# Patient Record
Sex: Female | Born: 1965 | Race: Black or African American | Hispanic: No | Marital: Married | Smoking: Never smoker
Health system: Southern US, Community
[De-identification: ages and names within clinical notes are randomized; demographics above are authoritative.]

## PROBLEM LIST (undated history)

## (undated) DIAGNOSIS — E785 Hyperlipidemia, unspecified: Secondary | ICD-10-CM

## (undated) HISTORY — DX: Hyperlipidemia, unspecified: E78.5

## (undated) HISTORY — PX: GANGLION CYST EXCISION: SHX1691

## (undated) HISTORY — PX: VEIN LIGATION AND STRIPPING: SHX2653

---

## 2016-04-11 DIAGNOSIS — L731 Pseudofolliculitis barbae: Secondary | ICD-10-CM | POA: Diagnosis not present

## 2016-04-11 DIAGNOSIS — N898 Other specified noninflammatory disorders of vagina: Secondary | ICD-10-CM | POA: Diagnosis not present

## 2016-04-13 DIAGNOSIS — Z01419 Encounter for gynecological examination (general) (routine) without abnormal findings: Secondary | ICD-10-CM | POA: Diagnosis not present

## 2016-04-14 DIAGNOSIS — Z01419 Encounter for gynecological examination (general) (routine) without abnormal findings: Secondary | ICD-10-CM | POA: Diagnosis not present

## 2016-04-14 DIAGNOSIS — Z113 Encounter for screening for infections with a predominantly sexual mode of transmission: Secondary | ICD-10-CM | POA: Diagnosis not present

## 2016-04-14 DIAGNOSIS — Z1151 Encounter for screening for human papillomavirus (HPV): Secondary | ICD-10-CM | POA: Diagnosis not present

## 2016-04-21 DIAGNOSIS — J0100 Acute maxillary sinusitis, unspecified: Secondary | ICD-10-CM | POA: Diagnosis not present

## 2016-04-21 DIAGNOSIS — J029 Acute pharyngitis, unspecified: Secondary | ICD-10-CM | POA: Diagnosis not present

## 2016-04-21 DIAGNOSIS — Z6825 Body mass index (BMI) 25.0-25.9, adult: Secondary | ICD-10-CM | POA: Diagnosis not present

## 2016-04-28 DIAGNOSIS — Z113 Encounter for screening for infections with a predominantly sexual mode of transmission: Secondary | ICD-10-CM | POA: Diagnosis not present

## 2016-04-28 DIAGNOSIS — A6009 Herpesviral infection of other urogenital tract: Secondary | ICD-10-CM | POA: Diagnosis not present

## 2016-05-06 ENCOUNTER — Encounter (HOSPITAL_BASED_OUTPATIENT_CLINIC_OR_DEPARTMENT_OTHER): Payer: Self-pay | Admitting: *Deleted

## 2016-05-06 ENCOUNTER — Emergency Department (HOSPITAL_BASED_OUTPATIENT_CLINIC_OR_DEPARTMENT_OTHER)
Admission: EM | Admit: 2016-05-06 | Discharge: 2016-05-06 | Disposition: A | Discharge status: Home / Self Care | Payer: BLUE CROSS/BLUE SHIELD | Attending: Emergency Medicine | Admitting: Emergency Medicine

## 2016-05-06 DIAGNOSIS — L259 Unspecified contact dermatitis, unspecified cause: Secondary | ICD-10-CM | POA: Insufficient documentation

## 2016-05-06 DIAGNOSIS — Z79899 Other long term (current) drug therapy: Secondary | ICD-10-CM | POA: Insufficient documentation

## 2016-05-06 DIAGNOSIS — R21 Rash and other nonspecific skin eruption: Secondary | ICD-10-CM | POA: Diagnosis present

## 2016-05-06 MED ORDER — HYDROCORTISONE 1 % EX CREA: TOPICAL_CREAM | CUTANEOUS | 0 refills | Status: AC

## 2016-05-06 NOTE — ED Triage Notes (Signed)
Patient states she developed a rash on her left mid arm about 5-7 days ago.  Now rash has spread to right upper arm.  Has used itching cream and benadryl with minimal relief.

## 2016-05-06 NOTE — Discharge Instructions (Signed)
Use a hydrocortisone cream twice a day to both affected areas. Would expect some resolution over the next 7 days. Return for any new or worse symptoms.

## 2016-05-06 NOTE — ED Provider Notes (Signed)
[facility]   CSN: [id number] Arrival date & time: 05/06/16  4098     History   Chief Complaint Chief Complaint  Patient presents with  . Rash    HPI Jamie Parker is a 50 y.o. female.  Patient with 7 day history of rash to left arm itchy in nature not painful few days later similar thing started on the right upper arm. No other symptoms no fever no nausea no vomiting no headaches no bodyaches no history of any bug bites. No history of any exposure to poison ivy. No pets at home. No history of similar problem.      History reviewed. No pertinent past medical history.  There are no active problems to display for this patient.   Past Surgical History:  Procedure Laterality Date  . GANGLION CYST EXCISION Right   . VEIN LIGATION AND STRIPPING      OB History    No data available       Home Medications    Prior to Admission medications   Medication Sig Start Date End Date Taking? Authorizing Provider  Multiple Vitamin (MULTIVITAMIN) tablet Take 1 tablet by mouth daily.   Yes Historical Provider, MD  Omega-3 Fatty Acids (FISH OIL) 1000 MG CAPS Take 1,400 mg by mouth 1 day or 1 dose.   Yes Historical Provider, MD  hydrocortisone cream 1 % Apply to affected area 2 times daily 05/06/16   Vanetta Mulders, MD    Family History No family history on file.  Social History Social History  Substance Use Topics  . Smoking status: Not on file  . Smokeless tobacco: Not on file  . Alcohol use Not on file     Allergies   Shellfish allergy   Review of Systems Review of Systems  Constitutional: Negative for fever.  HENT: Negative for congestion.   Eyes: Negative for redness.  Respiratory: Negative for shortness of breath.   Cardiovascular: Negative for chest pain.  Gastrointestinal: Negative for abdominal pain.  Genitourinary: Negative for dysuria and hematuria.  Musculoskeletal: Negative for back pain and myalgias.  Skin: Positive  for rash.  Neurological: Negative for headaches.  Hematological: Does not bruise/bleed easily.  Psychiatric/Behavioral: Negative for confusion.     Physical Exam Updated Vital Signs BP 135/86 (BP Location: Left Arm)   Pulse (!) 56   Temp 98.3 F (36.8 C) (Oral)   Resp 18   Ht 5\' 5"  (1.651 m)   Wt 68 kg   SpO2 100%   BMI 24.96 kg/m   Physical Exam  Constitutional: She is oriented to person, place, and time. She appears well-developed and well-nourished. No distress.  HENT:  Head: Normocephalic and atraumatic.  Mouth/Throat: Oropharynx is clear and moist.  Eyes: EOM are normal. Pupils are equal, round, and reactive to light.  Pulmonary/Chest: Effort normal and breath sounds normal.  Abdominal: Soft. Bowel sounds are normal.  Neurological: She is alert and oriented to person, place, and time.  Skin: Skin is warm. Rash noted.  Left arm near elbow with about a 5 cm area of superficial bumps without vesicles area of some erythema no evidence of increased warmth or infection. Similar area on right arm higher up.  Nursing note and vitals reviewed.    ED Treatments / Results  Labs (all labs ordered are listed, but only abnormal results are displayed) Labs Reviewed - No data to display  EKG  EKG Interpretation None       Radiology No results  found.  Procedures Procedures (including critical care time)  Medications Ordered in ED Medications - No data to display   Initial Impression / Assessment and Plan / ED Course  I have reviewed the triage vital signs and the nursing notes.  Pertinent labs & imaging results that were available during my care of the patient were reviewed by me and considered in my medical decision making (see chart for details).  Clinical Course   Findings on the skin left arm near the elbow and right upper arm consistent with a contact dermatitis source not clear. Will treat with hydrocortisone cream. No systemic symptoms patient nontoxic no  acute distress.   Final Clinical Impressions(s) / ED Diagnoses   Final diagnoses:  Contact dermatitis, unspecified contact dermatitis type, unspecified trigger    New Prescriptions New Prescriptions   HYDROCORTISONE CREAM 1 %    Apply to affected area 2 times daily     Vanetta MuldersScott Cher Franzoni, MD 05/06/16 1128

## 2016-06-23 DIAGNOSIS — L810 Postinflammatory hyperpigmentation: Secondary | ICD-10-CM | POA: Diagnosis not present

## 2016-07-05 DIAGNOSIS — A6004 Herpesviral vulvovaginitis: Secondary | ICD-10-CM | POA: Diagnosis not present

## 2016-07-05 DIAGNOSIS — Z7689 Persons encountering health services in other specified circumstances: Secondary | ICD-10-CM | POA: Diagnosis not present

## 2016-07-05 DIAGNOSIS — K648 Other hemorrhoids: Secondary | ICD-10-CM | POA: Diagnosis not present

## 2016-07-14 DIAGNOSIS — D485 Neoplasm of uncertain behavior of skin: Secondary | ICD-10-CM | POA: Diagnosis not present

## 2016-07-25 DIAGNOSIS — Z1231 Encounter for screening mammogram for malignant neoplasm of breast: Secondary | ICD-10-CM | POA: Diagnosis not present

## 2016-07-25 DIAGNOSIS — L309 Dermatitis, unspecified: Secondary | ICD-10-CM | POA: Diagnosis not present

## 2016-08-15 DIAGNOSIS — N760 Acute vaginitis: Secondary | ICD-10-CM | POA: Diagnosis not present

## 2016-09-06 DIAGNOSIS — Z800 Family history of malignant neoplasm of digestive organs: Secondary | ICD-10-CM | POA: Diagnosis not present

## 2016-09-06 DIAGNOSIS — Z1211 Encounter for screening for malignant neoplasm of colon: Secondary | ICD-10-CM | POA: Diagnosis not present

## 2016-09-26 DIAGNOSIS — T7840XA Allergy, unspecified, initial encounter: Secondary | ICD-10-CM | POA: Diagnosis not present

## 2016-09-26 DIAGNOSIS — L299 Pruritus, unspecified: Secondary | ICD-10-CM | POA: Diagnosis not present

## 2016-11-29 DIAGNOSIS — I872 Venous insufficiency (chronic) (peripheral): Secondary | ICD-10-CM | POA: Diagnosis not present

## 2016-11-29 DIAGNOSIS — Z23 Encounter for immunization: Secondary | ICD-10-CM | POA: Diagnosis not present

## 2016-11-29 DIAGNOSIS — Z0000 Encounter for general adult medical examination without abnormal findings: Secondary | ICD-10-CM | POA: Diagnosis not present

## 2016-11-29 DIAGNOSIS — I83893 Varicose veins of bilateral lower extremities with other complications: Secondary | ICD-10-CM | POA: Diagnosis not present

## 2016-12-05 DIAGNOSIS — Z0000 Encounter for general adult medical examination without abnormal findings: Secondary | ICD-10-CM | POA: Diagnosis not present

## 2016-12-14 DIAGNOSIS — E782 Mixed hyperlipidemia: Secondary | ICD-10-CM | POA: Diagnosis not present

## 2016-12-22 DIAGNOSIS — I872 Venous insufficiency (chronic) (peripheral): Secondary | ICD-10-CM | POA: Diagnosis not present

## 2017-01-23 DIAGNOSIS — I83893 Varicose veins of bilateral lower extremities with other complications: Secondary | ICD-10-CM | POA: Diagnosis not present

## 2017-01-23 DIAGNOSIS — I872 Venous insufficiency (chronic) (peripheral): Secondary | ICD-10-CM | POA: Diagnosis not present

## 2017-03-20 DIAGNOSIS — E782 Mixed hyperlipidemia: Secondary | ICD-10-CM | POA: Diagnosis not present

## 2017-03-20 DIAGNOSIS — H6123 Impacted cerumen, bilateral: Secondary | ICD-10-CM | POA: Diagnosis not present

## 2017-07-19 DIAGNOSIS — H04123 Dry eye syndrome of bilateral lacrimal glands: Secondary | ICD-10-CM | POA: Diagnosis not present

## 2017-08-20 DIAGNOSIS — E782 Mixed hyperlipidemia: Secondary | ICD-10-CM | POA: Diagnosis not present

## 2017-08-21 DIAGNOSIS — Z01419 Encounter for gynecological examination (general) (routine) without abnormal findings: Secondary | ICD-10-CM | POA: Diagnosis not present

## 2017-08-23 DIAGNOSIS — Z01419 Encounter for gynecological examination (general) (routine) without abnormal findings: Secondary | ICD-10-CM | POA: Diagnosis not present

## 2017-10-11 DIAGNOSIS — Z1231 Encounter for screening mammogram for malignant neoplasm of breast: Secondary | ICD-10-CM | POA: Diagnosis not present

## 2017-11-21 DIAGNOSIS — H16223 Keratoconjunctivitis sicca, not specified as Sjogren's, bilateral: Secondary | ICD-10-CM | POA: Diagnosis not present

## 2018-01-28 DIAGNOSIS — G5601 Carpal tunnel syndrome, right upper limb: Secondary | ICD-10-CM | POA: Diagnosis not present

## 2018-01-28 DIAGNOSIS — R52 Pain, unspecified: Secondary | ICD-10-CM | POA: Diagnosis not present

## 2018-02-18 DIAGNOSIS — Z0000 Encounter for general adult medical examination without abnormal findings: Secondary | ICD-10-CM | POA: Diagnosis not present

## 2018-03-12 DIAGNOSIS — N3091 Cystitis, unspecified with hematuria: Secondary | ICD-10-CM | POA: Diagnosis not present

## 2018-03-12 DIAGNOSIS — R102 Pelvic and perineal pain: Secondary | ICD-10-CM | POA: Diagnosis not present

## 2018-03-12 DIAGNOSIS — R3915 Urgency of urination: Secondary | ICD-10-CM | POA: Diagnosis not present

## 2018-03-12 DIAGNOSIS — B9620 Unspecified Escherichia coli [E. coli] as the cause of diseases classified elsewhere: Secondary | ICD-10-CM | POA: Diagnosis not present

## 2018-03-19 DIAGNOSIS — G5601 Carpal tunnel syndrome, right upper limb: Secondary | ICD-10-CM | POA: Diagnosis not present

## 2018-03-22 DIAGNOSIS — R300 Dysuria: Secondary | ICD-10-CM | POA: Diagnosis not present

## 2018-05-24 DIAGNOSIS — H16223 Keratoconjunctivitis sicca, not specified as Sjogren's, bilateral: Secondary | ICD-10-CM | POA: Diagnosis not present

## 2018-08-19 DIAGNOSIS — E782 Mixed hyperlipidemia: Secondary | ICD-10-CM | POA: Diagnosis not present

## 2018-08-29 DIAGNOSIS — J209 Acute bronchitis, unspecified: Secondary | ICD-10-CM | POA: Diagnosis not present

## 2018-11-29 DIAGNOSIS — Z1231 Encounter for screening mammogram for malignant neoplasm of breast: Secondary | ICD-10-CM | POA: Diagnosis not present

## 2018-11-29 DIAGNOSIS — Z1239 Encounter for other screening for malignant neoplasm of breast: Secondary | ICD-10-CM | POA: Diagnosis not present

## 2018-12-04 DIAGNOSIS — H16223 Keratoconjunctivitis sicca, not specified as Sjogren's, bilateral: Secondary | ICD-10-CM | POA: Diagnosis not present

## 2019-01-29 DIAGNOSIS — R252 Cramp and spasm: Secondary | ICD-10-CM | POA: Diagnosis not present

## 2019-01-29 DIAGNOSIS — R600 Localized edema: Secondary | ICD-10-CM | POA: Diagnosis not present

## 2019-01-29 DIAGNOSIS — Z79899 Other long term (current) drug therapy: Secondary | ICD-10-CM | POA: Diagnosis not present

## 2019-01-29 DIAGNOSIS — E782 Mixed hyperlipidemia: Secondary | ICD-10-CM | POA: Diagnosis not present

## 2019-01-29 DIAGNOSIS — Z1211 Encounter for screening for malignant neoplasm of colon: Secondary | ICD-10-CM | POA: Diagnosis not present

## 2019-02-18 ENCOUNTER — Other Ambulatory Visit: Payer: Self-pay | Admitting: *Deleted

## 2019-02-18 DIAGNOSIS — M79662 Pain in left lower leg: Secondary | ICD-10-CM

## 2019-02-19 ENCOUNTER — Telehealth (HOSPITAL_COMMUNITY): Payer: Self-pay

## 2019-02-19 NOTE — Telephone Encounter (Signed)
The above patient or their representative was contacted and gave the following answers to these questions:         Do you have any of the following symptoms? No  Fever                    Cough                   Shortness of breath  Do  you have any of the following other symptoms?  No   muscle pain         vomiting,        diarrhea        rash         weakness        red eye        abdominal pain         bruising          bruising or bleeding              joint pain           severe headache    Have you been in contact with someone who was or has been sick in the past 2 weeks? No  Yes                 Unsure                         Unable to assess   Does the person that you were in contact with have any of the following symptoms? N/A   Cough         shortness of breath           muscle pain         vomiting,            diarrhea            rash            weakness           fever            red eye           abdominal pain           bruising  or  bleeding                joint pain                severe headache                COMMENTS OR ACTION PLAN FOR THIS PATIENT:         

## 2019-02-20 ENCOUNTER — Encounter: Payer: Self-pay | Admitting: Vascular Surgery

## 2019-02-20 ENCOUNTER — Ambulatory Visit (HOSPITAL_COMMUNITY)
Admission: RE | Admit: 2019-02-20 | Discharge: 2019-02-20 | Disposition: A | Discharge status: Home / Self Care | Payer: BC Managed Care – PPO | Source: Ambulatory Visit | Attending: Vascular Surgery | Admitting: Vascular Surgery

## 2019-02-20 ENCOUNTER — Ambulatory Visit (INDEPENDENT_AMBULATORY_CARE_PROVIDER_SITE_OTHER): Payer: BC Managed Care – PPO | Admitting: Vascular Surgery

## 2019-02-20 ENCOUNTER — Other Ambulatory Visit: Payer: Self-pay

## 2019-02-20 VITALS — BP 128/81 | HR 57 | Temp 98.5°F | Resp 16 | Ht 65.0 in | Wt 142.4 lb

## 2019-02-20 DIAGNOSIS — I83812 Varicose veins of left lower extremities with pain: Secondary | ICD-10-CM

## 2019-02-20 DIAGNOSIS — M79662 Pain in left lower leg: Secondary | ICD-10-CM

## 2019-02-20 NOTE — Progress Notes (Signed)
REASON FOR CONSULT:    Edema left lower extremity and varicose veins left lower extremity.  The consult is requested by Rupashree Varadarajan  ASSESSMENT & PLAN:   LEFT LEG SWELLING: The patient has some mild left lower extremity swelling.  I reassured her that she has no evidence of DVT.  Likewise she has no evidence of chronic venous insufficiency.  I have discussed with her the importance of intermittent leg elevation and the proper positioning for this.  In addition I have written her a prescription for knee-high compression stockings with a gradient of 15 to 20 mmHg.  I have encouraged her to avoid prolonged sitting and standing.  We discussed the importance of exercise specifically walking and water aerobics.  She does not need any further venous work-up from our standpoint.  I have also reassured her that she has no evidence of arterial insufficiency.  She has palpable pedal pulses.  I will see her back as needed.   HPI:   Jamie Parker is a pleasant 53 y.o. female,  who had the left great saphenous vein ablated in 2011 for swelling improved.  However she is developed some recurrent swelling in both legs but more significantly on the left side.  She works at [employer] and is on her feet for long hours.  The swelling is worse when she is on her feet all day.  She also experiences some cramps in this is relieved somewhat with mustard.  I do not get any history of previous DVT or phlebitis.  She denies any history of claudication, rest pain, or nonhealing ulcers.  Her only real risk factor for peripheral vascular disease is hypercholesterolemia.  She denies any history of diabetes, hypertension, family history of premature cardiovascular disease, or tobacco use.  I reviewed the records from the referring office.  The patient was seen on 01/29/2019.  The patient does have a history of hyperlipidemia.  She is also had some leg cramps and is on CoQ10.  She was also noted some left leg swelling  and is sent for vascular consultation.  Past Medical History:  Diagnosis Date  . Hyperlipidemia     History reviewed. No pertinent family history.  SOCIAL HISTORY: Social History   Socioeconomic History  . Marital status: Married    Spouse name: Not on file  . Number of children: Not on file  . Years of education: Not on file  . Highest education level: Not on file  Occupational History  . Not on file  Social Needs  . Financial resource strain: Not on file  . Food insecurity    Worry: Not on file    Inability: Not on file  . Transportation needs    Medical: Not on file    Non-medical: Not on file  Tobacco Use  . Smoking status: Never Smoker  . Smokeless tobacco: Never Used  Substance and Sexual Activity  . Alcohol use: Yes  . Drug use: Never  . Sexual activity: Not on file  Lifestyle  . Physical activity    Days per week: Not on file    Minutes per session: Not on file  . Stress: Not on file  Relationships  . Social Herbalist on phone: Not on file    Gets together: Not on file    Attends religious service: Not on file    Active member of club or organization: Not on file    Attends meetings of clubs or organizations: Not  on file    Relationship status: Not on file  . Intimate partner violence    Fear of current or ex partner: Not on file    Emotionally abused: Not on file    Physically abused: Not on file    Forced sexual activity: Not on file  Other Topics Concern  . Not on file  Social History Narrative  . Not on file    Allergies  Allergen Reactions  . Shellfish Allergy Anaphylaxis    Current Outpatient Medications  Medication Sig Dispense Refill  . cetirizine (ZYRTEC) 10 MG tablet Take 10 mg by mouth daily.    . fluticasone (FLONASE) 50 MCG/ACT nasal spray Place into both nostrils daily.    . hydrocortisone cream 1 % Apply to affected area 2 times daily 15 g 0  . Multiple Vitamin (MULTIVITAMIN) tablet Take 1 tablet by mouth daily.     . Omega-3 Fatty Acids (FISH OIL) 1000 MG CAPS Take 1,400 mg by mouth 1 day or 1 dose.    . RESTASIS 0.05 % ophthalmic emulsion INSTILL 1 DROP INTO BOTH EYES TWICE A DAY    . rosuvastatin (CRESTOR) 10 MG tablet Take 10 mg by mouth daily.     No current facility-administered medications for this visit.     REVIEW OF SYSTEMS:  [X]  denotes positive finding, [ ]  denotes negative finding Cardiac  Comments:  Chest pain or chest pressure:    Shortness of breath upon exertion:    Short of breath when lying flat:    Irregular heart rhythm:        Vascular    Pain in calf, thigh, or hip brought on by ambulation:    Pain in feet at night that wakes you up from your sleep:     Blood clot in your veins:    Leg swelling:         Pulmonary    Oxygen at home:    Productive cough:     Wheezing:         Neurologic    Sudden weakness in arms or legs:     Sudden numbness in arms or legs:     Sudden onset of difficulty speaking or slurred speech:    Temporary loss of vision in one eye:     Problems with dizziness:         Gastrointestinal    Blood in stool:     Vomited blood:         Genitourinary    Burning when urinating:     Blood in urine:        Psychiatric    Major depression:         Hematologic    Bleeding problems:    Problems with blood clotting too easily:        Skin    Rashes or ulcers:        Constitutional    Fever or chills:     PHYSICAL EXAM:   Vitals:   02/20/19 0849  BP: 128/81  Pulse: (!) 57  Resp: 16  Temp: 98.5 F (36.9 C)  TempSrc: Temporal  SpO2: 100%  Weight: 142 lb 6.4 oz (64.6 kg)  Height: 5\' 5"  (1.651 m)    GENERAL: The patient is a well-nourished female, in no acute distress. The vital signs are documented above. CARDIAC: There is a regular rate and rhythm.  VASCULAR: I do not detect carotid bruits. She has palpable dorsalis pedis pulses bilaterally. Currently she  does not have any significant lower extremity swelling. She does not  have any hyperpigmentation, varicose veins or telangiectasias. PULMONARY: There is good air exchange bilaterally without wheezing or rales. ABDOMEN: Soft and non-tender with normal pitched bowel sounds.  MUSCULOSKELETAL: There are no major deformities or cyanosis. NEUROLOGIC: No focal weakness or paresthesias are detected. SKIN: There are no ulcers or rashes noted. PSYCHIATRIC: The patient has a normal affect.  DATA:    VENOUS DUPLEX: I have independently interpreted her venous duplex scan today.  She has no evidence of DVT in the left lower extremity.  There is no evidence of phlebitis.  Her great saphenous vein is absent.  She has no evidence of deep venous reflux.

## 2019-02-26 ENCOUNTER — Other Ambulatory Visit (HOSPITAL_COMMUNITY)
Admission: RE | Admit: 2019-02-26 | Discharge: 2019-02-26 | Disposition: A | Discharge status: Home / Self Care | Payer: BC Managed Care – PPO | Source: Ambulatory Visit | Attending: Internal Medicine | Admitting: Internal Medicine

## 2019-02-26 ENCOUNTER — Other Ambulatory Visit: Payer: Self-pay | Admitting: Internal Medicine

## 2019-02-26 DIAGNOSIS — Z0000 Encounter for general adult medical examination without abnormal findings: Secondary | ICD-10-CM | POA: Diagnosis not present

## 2019-02-26 DIAGNOSIS — Z01419 Encounter for gynecological examination (general) (routine) without abnormal findings: Secondary | ICD-10-CM | POA: Diagnosis not present

## 2019-02-26 DIAGNOSIS — E782 Mixed hyperlipidemia: Secondary | ICD-10-CM | POA: Diagnosis not present

## 2019-02-26 DIAGNOSIS — Z124 Encounter for screening for malignant neoplasm of cervix: Secondary | ICD-10-CM | POA: Insufficient documentation

## 2019-02-26 DIAGNOSIS — Z1211 Encounter for screening for malignant neoplasm of colon: Secondary | ICD-10-CM | POA: Diagnosis not present

## 2019-03-05 LAB — CYTOLOGY - PAP
Diagnosis: NEGATIVE
HPV: NOT DETECTED

## 2019-06-05 DIAGNOSIS — H16223 Keratoconjunctivitis sicca, not specified as Sjogren's, bilateral: Secondary | ICD-10-CM | POA: Diagnosis not present

## 2019-08-26 DIAGNOSIS — A609 Anogenital herpesviral infection, unspecified: Secondary | ICD-10-CM | POA: Diagnosis not present

## 2019-08-26 DIAGNOSIS — N898 Other specified noninflammatory disorders of vagina: Secondary | ICD-10-CM | POA: Diagnosis not present

## 2019-08-26 DIAGNOSIS — N952 Postmenopausal atrophic vaginitis: Secondary | ICD-10-CM | POA: Diagnosis not present

## 2019-09-25 DIAGNOSIS — N9089 Other specified noninflammatory disorders of vulva and perineum: Secondary | ICD-10-CM | POA: Diagnosis not present

## 2019-10-01 DIAGNOSIS — H16223 Keratoconjunctivitis sicca, not specified as Sjogren's, bilateral: Secondary | ICD-10-CM | POA: Diagnosis not present

## 2019-11-27 DIAGNOSIS — H16223 Keratoconjunctivitis sicca, not specified as Sjogren's, bilateral: Secondary | ICD-10-CM | POA: Diagnosis not present

## 2019-12-15 ENCOUNTER — Other Ambulatory Visit: Payer: Self-pay | Admitting: Internal Medicine

## 2019-12-15 ENCOUNTER — Ambulatory Visit
Admission: RE | Admit: 2019-12-15 | Discharge: 2019-12-15 | Disposition: A | Discharge status: Home / Self Care | Payer: BC Managed Care – PPO | Source: Ambulatory Visit | Attending: Internal Medicine | Admitting: Internal Medicine

## 2019-12-15 DIAGNOSIS — G540 Brachial plexus disorders: Secondary | ICD-10-CM

## 2019-12-15 DIAGNOSIS — M62838 Other muscle spasm: Secondary | ICD-10-CM

## 2019-12-15 DIAGNOSIS — M542 Cervicalgia: Secondary | ICD-10-CM | POA: Diagnosis not present

## 2019-12-15 DIAGNOSIS — M25511 Pain in right shoulder: Secondary | ICD-10-CM | POA: Diagnosis not present

## 2019-12-24 DIAGNOSIS — H5213 Myopia, bilateral: Secondary | ICD-10-CM | POA: Diagnosis not present

## 2020-01-08 DIAGNOSIS — G540 Brachial plexus disorders: Secondary | ICD-10-CM | POA: Diagnosis not present

## 2020-01-08 DIAGNOSIS — M25511 Pain in right shoulder: Secondary | ICD-10-CM | POA: Diagnosis not present

## 2020-03-04 DIAGNOSIS — Z0000 Encounter for general adult medical examination without abnormal findings: Secondary | ICD-10-CM | POA: Diagnosis not present

## 2020-03-04 DIAGNOSIS — E782 Mixed hyperlipidemia: Secondary | ICD-10-CM | POA: Diagnosis not present

## 2020-04-08 ENCOUNTER — Other Ambulatory Visit: Payer: Self-pay | Admitting: Internal Medicine

## 2020-04-08 DIAGNOSIS — Z0000 Encounter for general adult medical examination without abnormal findings: Secondary | ICD-10-CM

## 2020-04-08 DIAGNOSIS — H16223 Keratoconjunctivitis sicca, not specified as Sjogren's, bilateral: Secondary | ICD-10-CM | POA: Diagnosis not present

## 2020-05-04 ENCOUNTER — Other Ambulatory Visit: Payer: Self-pay

## 2020-05-04 ENCOUNTER — Ambulatory Visit
Admission: RE | Admit: 2020-05-04 | Discharge: 2020-05-04 | Disposition: A | Discharge status: Home / Self Care | Payer: BC Managed Care – PPO | Source: Ambulatory Visit | Attending: Internal Medicine | Admitting: Internal Medicine

## 2020-05-04 DIAGNOSIS — Z0000 Encounter for general adult medical examination without abnormal findings: Secondary | ICD-10-CM

## 2020-05-04 DIAGNOSIS — Z1231 Encounter for screening mammogram for malignant neoplasm of breast: Secondary | ICD-10-CM | POA: Diagnosis not present

## 2020-08-02 DIAGNOSIS — R059 Cough, unspecified: Secondary | ICD-10-CM | POA: Diagnosis not present

## 2020-08-02 DIAGNOSIS — R0981 Nasal congestion: Secondary | ICD-10-CM | POA: Diagnosis not present

## 2020-08-02 DIAGNOSIS — U071 COVID-19: Secondary | ICD-10-CM | POA: Diagnosis not present

## 2020-08-23 DIAGNOSIS — Z01419 Encounter for gynecological examination (general) (routine) without abnormal findings: Secondary | ICD-10-CM | POA: Diagnosis not present

## 2020-11-01 DIAGNOSIS — M792 Neuralgia and neuritis, unspecified: Secondary | ICD-10-CM | POA: Diagnosis not present

## 2020-11-01 DIAGNOSIS — D2372 Other benign neoplasm of skin of left lower limb, including hip: Secondary | ICD-10-CM | POA: Diagnosis not present

## 2020-11-01 DIAGNOSIS — L84 Corns and callosities: Secondary | ICD-10-CM | POA: Diagnosis not present

## 2020-12-01 DIAGNOSIS — H16223 Keratoconjunctivitis sicca, not specified as Sjogren's, bilateral: Secondary | ICD-10-CM | POA: Diagnosis not present

## 2020-12-29 DIAGNOSIS — N898 Other specified noninflammatory disorders of vagina: Secondary | ICD-10-CM | POA: Diagnosis not present

## 2020-12-29 DIAGNOSIS — R198 Other specified symptoms and signs involving the digestive system and abdomen: Secondary | ICD-10-CM | POA: Diagnosis not present

## 2021-01-21 DIAGNOSIS — K921 Melena: Secondary | ICD-10-CM | POA: Diagnosis not present

## 2021-01-21 DIAGNOSIS — M19079 Primary osteoarthritis, unspecified ankle and foot: Secondary | ICD-10-CM | POA: Diagnosis not present

## 2021-01-21 DIAGNOSIS — K648 Other hemorrhoids: Secondary | ICD-10-CM | POA: Diagnosis not present

## 2021-01-31 DIAGNOSIS — I83893 Varicose veins of bilateral lower extremities with other complications: Secondary | ICD-10-CM | POA: Diagnosis not present

## 2021-01-31 DIAGNOSIS — I83813 Varicose veins of bilateral lower extremities with pain: Secondary | ICD-10-CM | POA: Diagnosis not present

## 2021-02-15 DIAGNOSIS — I83893 Varicose veins of bilateral lower extremities with other complications: Secondary | ICD-10-CM | POA: Diagnosis not present

## 2021-03-08 DIAGNOSIS — I83812 Varicose veins of left lower extremities with pain: Secondary | ICD-10-CM | POA: Diagnosis not present

## 2021-03-11 DIAGNOSIS — Z0000 Encounter for general adult medical examination without abnormal findings: Secondary | ICD-10-CM | POA: Diagnosis not present

## 2021-03-11 DIAGNOSIS — E782 Mixed hyperlipidemia: Secondary | ICD-10-CM | POA: Diagnosis not present

## 2021-05-09 IMAGING — CR DG CERVICAL SPINE COMPLETE 4+V
5 series · 5 of 5 positions shown · non-contrast
Comparison: None.

CLINICAL DATA: Posterior neck pain without known injury.

EXAM:
CERVICAL SPINE - COMPLETE 4+ VIEW

[w c-spine lat]
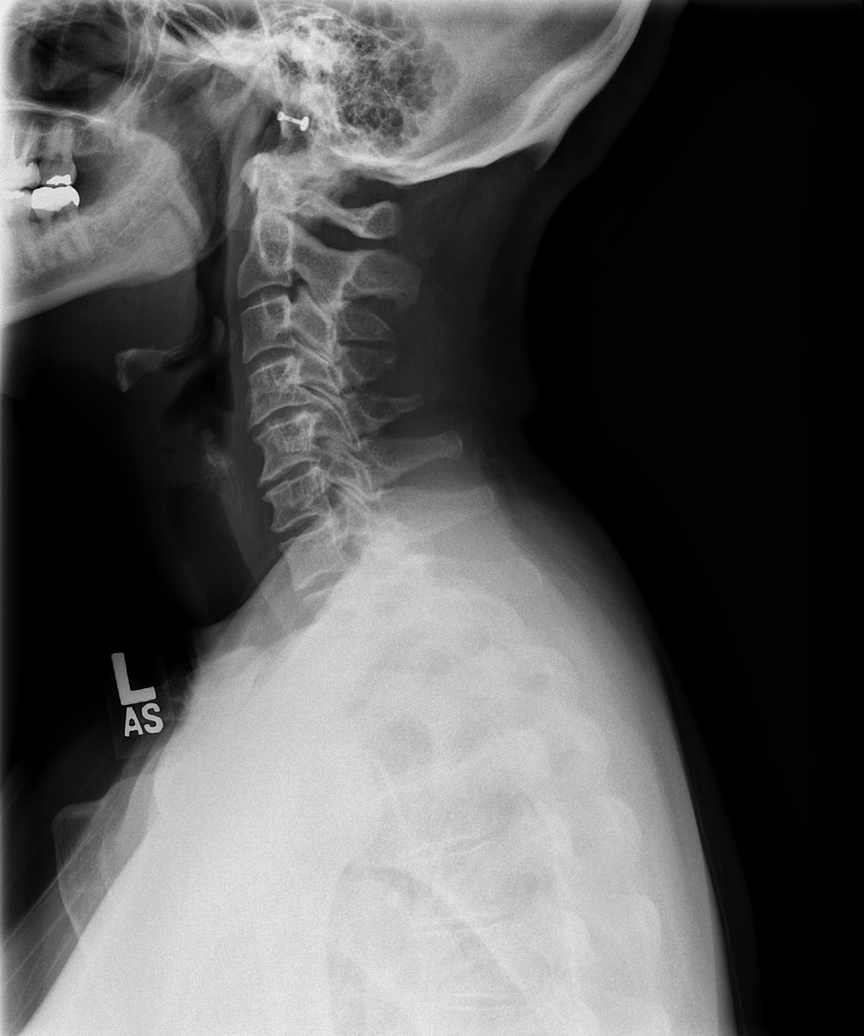

[w c-spine oblique (1 of 2)]
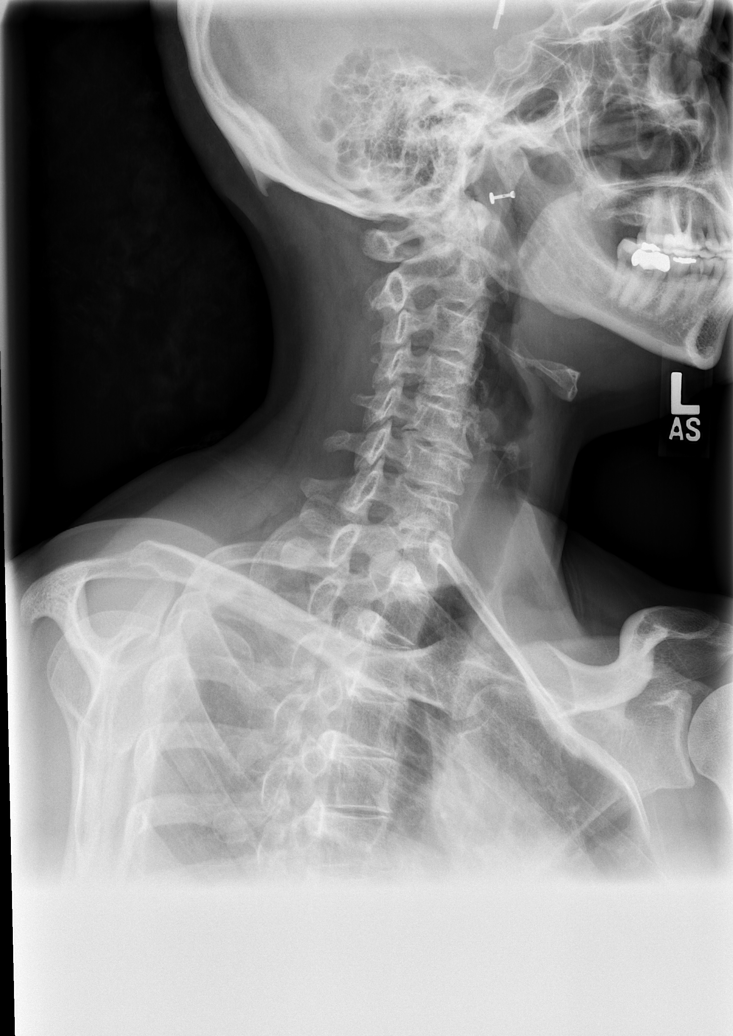

[w c-spine oblique (2 of 2)]
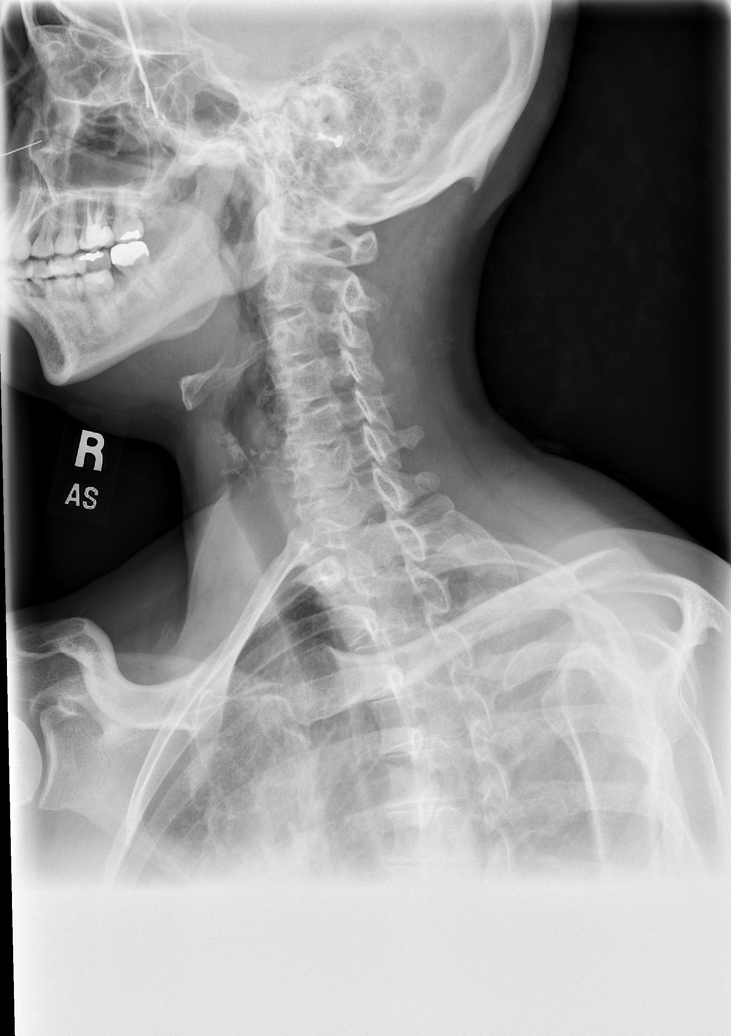

[w c-spine a.p. *]
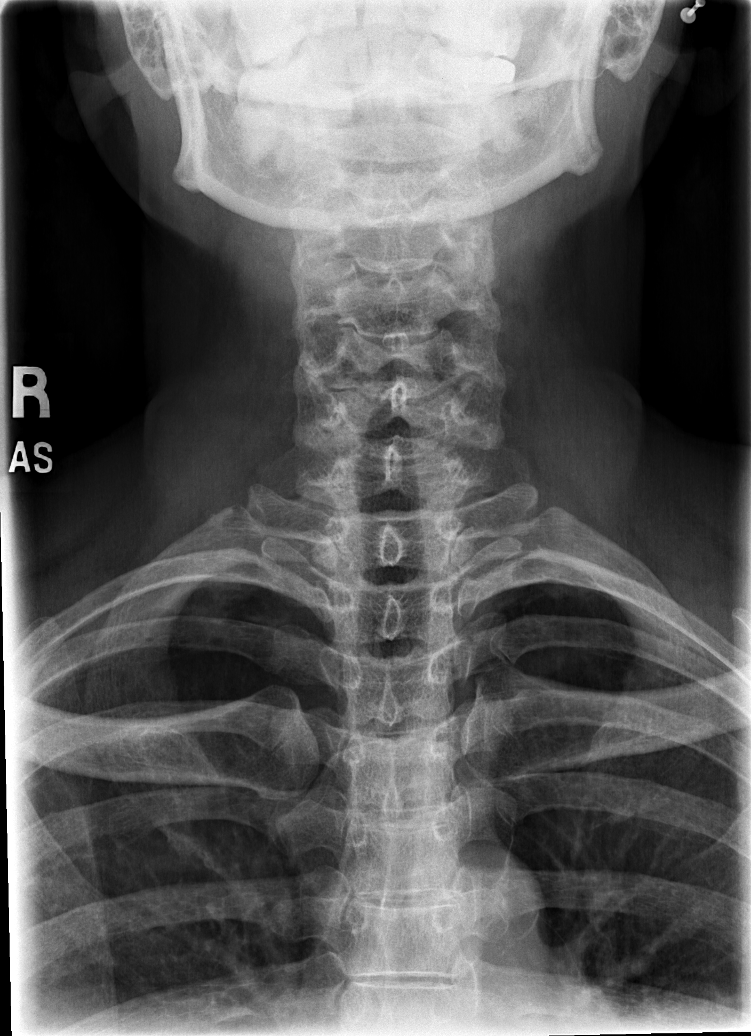

[w c-spine odontoid *]
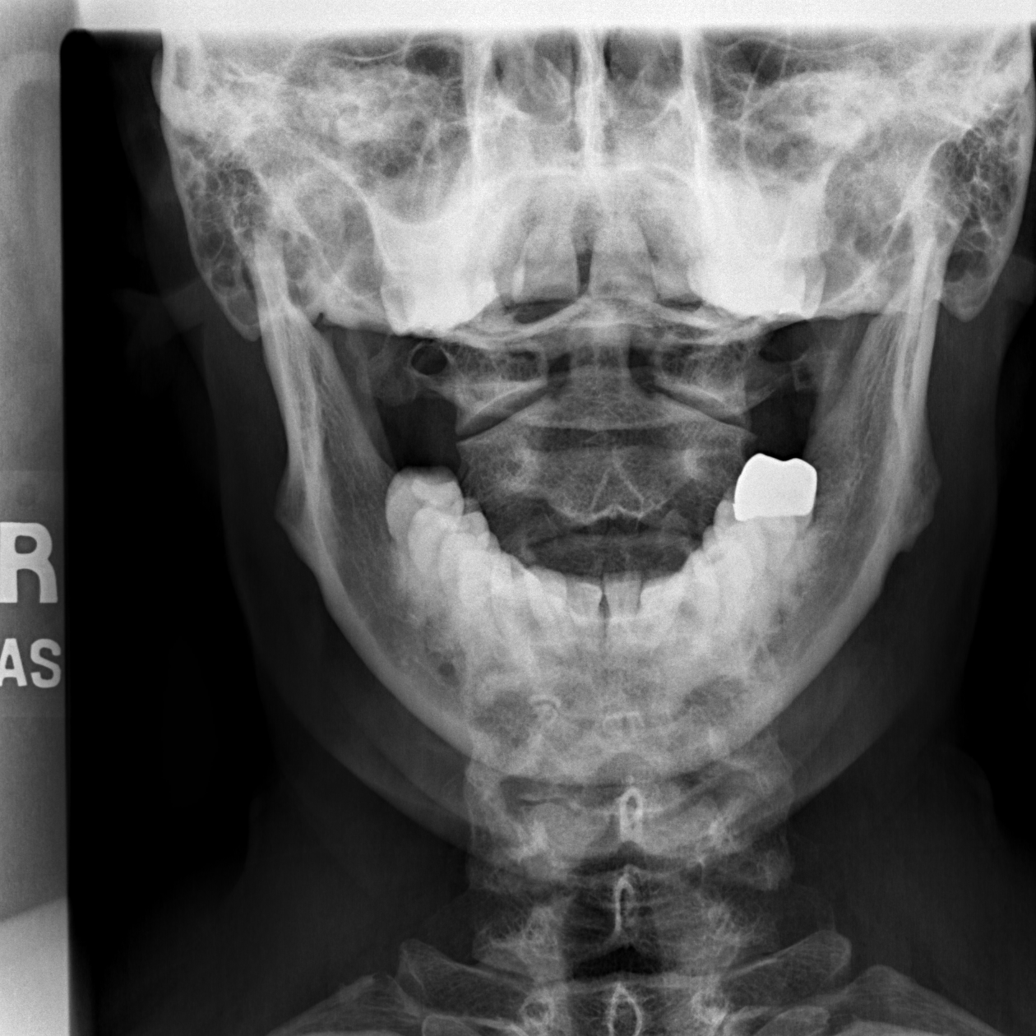

[5 of 5 positions shown; findings below may reference images not displayed]

FINDINGS: No fracture or spondylolisthesis is noted. Mild degenerative disc
disease is noted at C4-5 and C5-6 with anterior osteophyte
formation. No prevertebral soft tissue swelling is noted. Mild
right-sided neural foraminal stenosis is noted at C5-6 secondary to
uncovertebral spurring.
IMPRESSION: Mild multilevel degenerative disc disease. Mild right-sided neural
foraminal stenosis is noted at C5-6.

## 2021-06-20 ENCOUNTER — Other Ambulatory Visit: Payer: Self-pay | Admitting: Internal Medicine

## 2021-06-20 DIAGNOSIS — Z1231 Encounter for screening mammogram for malignant neoplasm of breast: Secondary | ICD-10-CM

## 2021-06-27 DIAGNOSIS — I83819 Varicose veins of unspecified lower extremities with pain: Secondary | ICD-10-CM | POA: Diagnosis not present

## 2021-07-05 DIAGNOSIS — I83819 Varicose veins of unspecified lower extremities with pain: Secondary | ICD-10-CM | POA: Diagnosis not present

## 2021-08-04 ENCOUNTER — Ambulatory Visit
Admission: RE | Admit: 2021-08-04 | Discharge: 2021-08-04 | Disposition: A | Discharge status: Home / Self Care | Payer: BC Managed Care – PPO | Source: Ambulatory Visit | Attending: Internal Medicine | Admitting: Internal Medicine

## 2021-08-04 DIAGNOSIS — Z1231 Encounter for screening mammogram for malignant neoplasm of breast: Secondary | ICD-10-CM

## 2021-08-22 DIAGNOSIS — I83819 Varicose veins of unspecified lower extremities with pain: Secondary | ICD-10-CM | POA: Diagnosis not present

## 2021-09-02 DIAGNOSIS — Z01419 Encounter for gynecological examination (general) (routine) without abnormal findings: Secondary | ICD-10-CM | POA: Diagnosis not present

## 2021-09-20 DIAGNOSIS — I83819 Varicose veins of unspecified lower extremities with pain: Secondary | ICD-10-CM | POA: Diagnosis not present

## 2021-09-20 DIAGNOSIS — I872 Venous insufficiency (chronic) (peripheral): Secondary | ICD-10-CM | POA: Diagnosis not present

## 2021-11-03 ENCOUNTER — Other Ambulatory Visit: Payer: Self-pay | Admitting: Internal Medicine

## 2021-11-03 ENCOUNTER — Ambulatory Visit
Admission: RE | Admit: 2021-11-03 | Discharge: 2021-11-03 | Disposition: A | Discharge status: Home / Self Care | Payer: BC Managed Care – PPO | Source: Ambulatory Visit | Attending: Internal Medicine | Admitting: Internal Medicine

## 2021-11-03 DIAGNOSIS — M25511 Pain in right shoulder: Secondary | ICD-10-CM | POA: Diagnosis not present

## 2021-11-03 DIAGNOSIS — G540 Brachial plexus disorders: Secondary | ICD-10-CM | POA: Diagnosis not present

## 2021-11-04 ENCOUNTER — Other Ambulatory Visit: Payer: Self-pay | Admitting: Internal Medicine

## 2021-11-04 DIAGNOSIS — G540 Brachial plexus disorders: Secondary | ICD-10-CM

## 2021-11-07 ENCOUNTER — Other Ambulatory Visit: Payer: Self-pay | Admitting: Internal Medicine

## 2021-11-07 DIAGNOSIS — G540 Brachial plexus disorders: Secondary | ICD-10-CM

## 2021-11-24 ENCOUNTER — Other Ambulatory Visit: Payer: BC Managed Care – PPO

## 2021-11-30 DIAGNOSIS — H16223 Keratoconjunctivitis sicca, not specified as Sjogren's, bilateral: Secondary | ICD-10-CM | POA: Diagnosis not present

## 2021-12-21 DIAGNOSIS — M542 Cervicalgia: Secondary | ICD-10-CM | POA: Diagnosis not present

## 2022-01-02 DIAGNOSIS — R519 Headache, unspecified: Secondary | ICD-10-CM | POA: Diagnosis not present

## 2022-01-02 DIAGNOSIS — M4722 Other spondylosis with radiculopathy, cervical region: Secondary | ICD-10-CM | POA: Diagnosis not present

## 2022-01-02 DIAGNOSIS — G540 Brachial plexus disorders: Secondary | ICD-10-CM | POA: Diagnosis not present

## 2022-01-02 DIAGNOSIS — E782 Mixed hyperlipidemia: Secondary | ICD-10-CM | POA: Diagnosis not present

## 2022-01-06 DIAGNOSIS — L259 Unspecified contact dermatitis, unspecified cause: Secondary | ICD-10-CM | POA: Diagnosis not present

## 2022-01-16 DIAGNOSIS — L255 Unspecified contact dermatitis due to plants, except food: Secondary | ICD-10-CM | POA: Diagnosis not present

## 2022-02-02 DIAGNOSIS — I83811 Varicose veins of right lower extremities with pain: Secondary | ICD-10-CM | POA: Diagnosis not present

## 2022-03-06 DIAGNOSIS — Z9889 Other specified postprocedural states: Secondary | ICD-10-CM | POA: Diagnosis not present

## 2022-03-06 DIAGNOSIS — I83811 Varicose veins of right lower extremities with pain: Secondary | ICD-10-CM | POA: Diagnosis not present

## 2022-04-10 DIAGNOSIS — Z0000 Encounter for general adult medical examination without abnormal findings: Secondary | ICD-10-CM | POA: Diagnosis not present

## 2022-04-10 DIAGNOSIS — Z23 Encounter for immunization: Secondary | ICD-10-CM | POA: Diagnosis not present

## 2022-04-10 DIAGNOSIS — E782 Mixed hyperlipidemia: Secondary | ICD-10-CM | POA: Diagnosis not present

## 2022-04-10 DIAGNOSIS — M4722 Other spondylosis with radiculopathy, cervical region: Secondary | ICD-10-CM | POA: Diagnosis not present

## 2022-09-01 ENCOUNTER — Other Ambulatory Visit: Payer: Self-pay | Admitting: Internal Medicine

## 2022-09-01 DIAGNOSIS — Z1231 Encounter for screening mammogram for malignant neoplasm of breast: Secondary | ICD-10-CM

## 2022-09-04 DIAGNOSIS — Z01419 Encounter for gynecological examination (general) (routine) without abnormal findings: Secondary | ICD-10-CM | POA: Diagnosis not present

## 2022-10-20 ENCOUNTER — Ambulatory Visit
Admission: RE | Admit: 2022-10-20 | Discharge: 2022-10-20 | Disposition: A | Discharge status: Home / Self Care | Payer: BC Managed Care – PPO | Source: Ambulatory Visit | Attending: Internal Medicine | Admitting: Internal Medicine

## 2022-10-20 DIAGNOSIS — Z1231 Encounter for screening mammogram for malignant neoplasm of breast: Secondary | ICD-10-CM

## 2023-02-19 DIAGNOSIS — H16223 Keratoconjunctivitis sicca, not specified as Sjogren's, bilateral: Secondary | ICD-10-CM | POA: Diagnosis not present

## 2023-02-22 DIAGNOSIS — Z1211 Encounter for screening for malignant neoplasm of colon: Secondary | ICD-10-CM | POA: Diagnosis not present

## 2023-04-13 DIAGNOSIS — E782 Mixed hyperlipidemia: Secondary | ICD-10-CM | POA: Diagnosis not present

## 2023-04-13 DIAGNOSIS — Z23 Encounter for immunization: Secondary | ICD-10-CM | POA: Diagnosis not present

## 2023-04-13 DIAGNOSIS — N952 Postmenopausal atrophic vaginitis: Secondary | ICD-10-CM | POA: Diagnosis not present

## 2023-04-13 DIAGNOSIS — A6000 Herpesviral infection of urogenital system, unspecified: Secondary | ICD-10-CM | POA: Diagnosis not present

## 2023-04-13 DIAGNOSIS — Z0000 Encounter for general adult medical examination without abnormal findings: Secondary | ICD-10-CM | POA: Diagnosis not present

## 2023-07-13 DIAGNOSIS — E782 Mixed hyperlipidemia: Secondary | ICD-10-CM | POA: Diagnosis not present

## 2023-07-13 DIAGNOSIS — E7800 Pure hypercholesterolemia, unspecified: Secondary | ICD-10-CM | POA: Diagnosis not present

## 2023-09-18 DIAGNOSIS — Z01419 Encounter for gynecological examination (general) (routine) without abnormal findings: Secondary | ICD-10-CM | POA: Diagnosis not present

## 2023-09-18 DIAGNOSIS — Z124 Encounter for screening for malignant neoplasm of cervix: Secondary | ICD-10-CM | POA: Diagnosis not present

## 2024-02-19 DIAGNOSIS — H16223 Keratoconjunctivitis sicca, not specified as Sjogren's, bilateral: Secondary | ICD-10-CM | POA: Diagnosis not present

## 2024-05-05 DIAGNOSIS — Z0000 Encounter for general adult medical examination without abnormal findings: Secondary | ICD-10-CM | POA: Diagnosis not present

## 2024-05-05 DIAGNOSIS — Z23 Encounter for immunization: Secondary | ICD-10-CM | POA: Diagnosis not present

## 2024-07-14 ENCOUNTER — Other Ambulatory Visit: Payer: Self-pay | Admitting: Internal Medicine

## 2024-07-14 DIAGNOSIS — Z1231 Encounter for screening mammogram for malignant neoplasm of breast: Secondary | ICD-10-CM

## 2024-07-23 ENCOUNTER — Ambulatory Visit
Admission: RE | Admit: 2024-07-23 | Discharge: 2024-07-23 | Disposition: A | Discharge status: Home / Self Care | Source: Ambulatory Visit | Attending: Internal Medicine | Admitting: Internal Medicine

## 2024-07-23 DIAGNOSIS — Z1231 Encounter for screening mammogram for malignant neoplasm of breast: Secondary | ICD-10-CM
